# Patient Record
Sex: Male | Born: 1970 | Race: Black or African American | Hispanic: No | Marital: Single
Health system: Midwestern US, Community
[De-identification: ages and names within clinical notes are randomized; demographics above are authoritative.]

## PROBLEM LIST (undated history)

## (undated) DIAGNOSIS — Z789 Other specified health status: Secondary | ICD-10-CM

---

## 2006-10-26 ENCOUNTER — Emergency Department (HOSPITAL_COMMUNITY): Admission: EM | Admit: 2006-10-26 | Discharge: 2006-10-26 | Payer: Self-pay | Admitting: Emergency Medicine

## 2014-05-19 MED ORDER — HYDROCODONE-ACETAMINOPHEN 5 MG-325 MG TAB
5-325 mg | ORAL_TABLET | ORAL | Status: AC | PRN
Start: 2014-05-19 — End: ?

## 2014-05-19 MED ORDER — MORPHINE 2 MG/ML INJECTION
2 mg/mL | INTRAMUSCULAR | Status: AC
Start: 2014-05-19 — End: 2014-05-19
  Administered 2014-05-19: 16:00:00 via INTRAVENOUS

## 2014-05-19 MED ORDER — MORPHINE 4 MG/ML SYRINGE
4 mg/mL | INTRAMUSCULAR | Status: AC
Start: 2014-05-19 — End: 2014-05-19
  Administered 2014-05-19: 17:00:00 via INTRAVENOUS

## 2014-05-19 MED FILL — MORPHINE 2 MG/ML INJECTION: 2 mg/mL | INTRAMUSCULAR | Qty: 2

## 2014-05-19 MED FILL — MORPHINE 4 MG/ML SYRINGE: 4 mg/mL | INTRAMUSCULAR | Qty: 1

## 2014-05-19 NOTE — ED Notes (Signed)
Triage note: Patient arrives via EMS due to fall through a ceiling, fall broken by some wiring, denies hitting head, denies LOC, c/o right wrist pain. + deformity noted to right wrist, limited movement. PVS intact.

## 2014-05-19 NOTE — ED Notes (Signed)
Patient verbalizes understanding of discharge instructions. Ambulatory and in no acute distress at discharge.

## 2014-05-19 NOTE — Consults (Signed)
ORTHO CONSULT    Subjective:     Date of Consultation:  May 19, 2014    Referring Physician: Claire ShownJohn Coyner, MD    Thomas GamblesMark Wittwer is a 43 y.o. African American male who is being seen for an acute injury to his right wrist. This injury occurred earlier today while he was at work. He states that he fell through a ceiling and tried to cushion his fall with an outstretched right arm. Workup has revealed a comminuted, impacted, and intra-articular distal radius fracture. He denies other associated injuries.     There are no active problems to display for this patient.    History reviewed. No pertinent family history.   History   Substance Use Topics   ??? Smoking status: Never Smoker    ??? Smokeless tobacco: Never Used   ??? Alcohol Use: Yes     History reviewed. No pertinent past medical history.   History reviewed. No pertinent past surgical history.   Prior to Admission medications    Medication Sig Start Date End Date Taking? Authorizing Provider   HYDROcodone-acetaminophen (NORCO) 5-325 mg per tablet Take 1 Tab by mouth every four (4) hours as needed for Pain. Max Daily Amount: 6 Tabs. 05/19/14  Yes Daryll BrodJohn L Coyner, MD     No current facility-administered medications for this encounter.     Current Outpatient Prescriptions   Medication Sig   ??? HYDROcodone-acetaminophen (NORCO) 5-325 mg per tablet Take 1 Tab by mouth every four (4) hours as needed for Pain. Max Daily Amount: 6 Tabs.     No Known Allergies     Review of Systems:  A comprehensive review of systems was negative except for that written in the HPI.    Objective:     Patient Vitals for the past 8 hrs:   BP Temp Pulse Resp SpO2 Height Weight   05/19/14 1330 115/73 mmHg 98 ??F (36.7 ??C) 50 18 97 % - -   05/19/14 1118 107/74 mmHg 98.5 ??F (36.9 ??C) 47 16 100 % 5\' 4"  (1.626 m) 68.04 kg (150 lb)     Temp (24hrs), Avg:98.3 ??F (36.8 ??C), Min:98 ??F (36.7 ??C), Max:98.5 ??F (36.9 ??C)        EXAM: General: alert, cooperative, no distress, appears stated age   Musculoskeletal: diffuse swelling/tenderness right wrist, minimal deformity  Neurological: negative for memory problems, speech problems, paresthesia and coordination problems  Skin:intact, no sign of open fracture    CR WRIST MIN 3 VWS RT - [id number] - May 19 2014 12:06PM  Accession No: [id number]  Reason: Trauma  REPORT:  EXAM: CR WRIST MIN 3 VWS RT  INDICATION: Fall, wrist pain  COMPARISON: None.  FINDINGS: Three views of the right wrist demonstrate a comminuted impaction   fracture the distal radius with intra-articular extension. Diffuse soft   tissue swelling is noted. There may be a fracture of the dorsum of the   triquetrum as well.   IMPRESSION: Comminuted impaction fracture distal radius. Possible of   fracture the dorsum of the triquetrum.  Signing/Reading Doctor: Gordan PaymentKAREN L. KILLEEN [phone]   Approved: Gordan PaymentKAREN L. KILLEEN [phone]([id number]) May 19 2014 12:27PM     Assessment/Plan:     Right distal radius fracture     Discussed diagnosis and treatment plan with patient. I explained that I did not feel that I could improve the alignment significantly with closed reduction and that he would need surgical intervention to restore the radiocarpal articular surface. I explained that I felt this  fracture would not do well with non-operative management. He seemed to understand. He was splinted and provided with pain medication. I recommended follow-up with Dr. Marquita Palms in 1-2 days      Sherlyn Lees, PA

## 2014-05-19 NOTE — ED Provider Notes (Signed)
HPI Comments: 43 y.o. male with no significant past medical history who presents via EMS with chief complaint of wrist pain. Patient reports right wrist pain following a fall "through the ceiling" from a height of "about 7-8 feet." Patient reports he is right-hand dominant. Patient denies a LOC. Patient denies seeing an orthopedist in the past. Patient denies a history of any other medical conditions. There are no other acute medical concerns at this time.    PCP: None (General)    Note written by Bethann Humble, Scribe, as dictated by Daryll Brod, MD 11:45 AM    The history is provided by the patient.        History reviewed. No pertinent past medical history.     History reviewed. No pertinent past surgical history.      History reviewed. No pertinent family history.     History     Social History   ??? Marital Status: SINGLE     Spouse Name: N/A     Number of Children: N/A   ??? Years of Education: N/A     Occupational History   ??? Not on file.     Social History Main Topics   ??? Smoking status: Never Smoker    ??? Smokeless tobacco: Never Used   ??? Alcohol Use: Yes   ??? Drug Use: No   ??? Sexual Activity: Not on file     Other Topics Concern   ??? Not on file     Social History Narrative   ??? No narrative on file                  ALLERGIES: Review of patient's allergies indicates no known allergies.      Review of Systems   Musculoskeletal: Positive for arthralgias.   All other systems reviewed and are negative.      Filed Vitals:    05/19/14 1118   BP: 107/74   Pulse: 47   Temp: 98.5 ??F (36.9 ??C)   Resp: 16   Height: 5\' 4"  (1.626 m)   Weight: 68.04 kg (150 lb)   SpO2: 100%            Physical Exam   Constitutional: He is oriented to person, place, and time. He appears well-developed and well-nourished.   HENT:   Head: Normocephalic and atraumatic.   Nose: Nose normal.   Mouth/Throat: Oropharynx is clear and moist.   Eyes: Conjunctivae and EOM are normal. Pupils are equal, round, and  reactive to light. Right eye exhibits no discharge. Left eye exhibits no discharge.   Neck: Normal range of motion. Neck supple.   Cardiovascular: Normal rate, regular rhythm and intact distal pulses.  Exam reveals no gallop and no friction rub.    No murmur heard.  Pulmonary/Chest: Effort normal and breath sounds normal. No respiratory distress. He has no wheezes. He has no rales. He exhibits no tenderness.   Abdominal: Soft. Bowel sounds are normal. He exhibits no distension and no mass. There is no tenderness. There is no rebound and no guarding.   nttp     Musculoskeletal: He exhibits tenderness. He exhibits no edema.        Right wrist: He exhibits decreased range of motion, tenderness, bony tenderness, swelling, effusion and deformity. He exhibits no laceration.   R wrist - obvious deformity/ decreased ROM 2ary to pain/ pt has distal motor/ CV/ Sensation grossly intact     Pt had central/ paraspinal C/T/L spines, Upper/Lower  ext long bones, Abdomen,  Pelvis, hands /feet and all joints palpated and tolerated well (except as above) ; Pt has motor/ CV / Sensation grossly intact to all extremities;     Lymphadenopathy:     He has no cervical adenopathy.   Neurological: He is alert and oriented to person, place, and time. No cranial nerve deficit. Coordination normal.   Skin: Skin is warm and dry. No rash noted. No erythema.   Psychiatric: He has a normal mood and affect.   Nursing note and vitals reviewed.       MDM  Number of Diagnoses or Management Options  Radius distal fracture, right, closed, initial encounter:   Triquetral chip fracture, right, closed, initial encounter:       Procedures    CONSULT NOTE:  12:15 PM Daryll Brod, MD spoke with Sherlyn Lees, PA, Consult for Orthopedics. Discussed available diagnostic tests and clinical findings. Provider is in agreement with care plans as outlined. Provider will come see the patient.    PROGRESS NOTE:  12:31 PM   Discussed the patient's results and the possibility of surgery with him; patient understands and agrees with plan. Orthopedics PA is seeing the patient now.     CONSULT NOTE:  12:56 PM Daryll Brod, MD spoke with Sherlyn Lees, PA, Consult for Orthopedics. Discussed available diagnostic tests and clinical findings. Provider is in agreement with care plans as outlined. Provider recommends pain medications and a follow-up appointment with the Orthopedics office for surgery.    1:20 PM  Thomas Dunn  results have been reviewed with him.  He has been counseled regarding his diagnosis.  He verbally conveys understanding and agreement of the signs, symptoms, diagnosis, treatment and prognosis and additionally agrees to Call/ Arrange follow up as recommended with Dr. Marquita Palms  in 24 - 48 hours.  He also agrees with the care-plan and conveys that all of his questions have been answered.  I have also put together some discharge instructions for him that include: 1) educational information regarding their diagnosis, 2) how to care for their diagnosis at home, as well a 3) list of reasons why they would want to return to the ED prior to their follow-up appointment, should their condition change or for concerns.

## 2014-05-20 NOTE — ED Notes (Signed)
Discharge instructions reviewed with the patient who verbalized understanding.  Pt is ambulatory from the department.

## 2014-05-20 NOTE — ED Notes (Signed)
He was seen here yesterday for a right arm Fx.  He is here now with a splint from yesterday saying his fingers were swollen and tingling. He believed the splint was too tight. He is also saying the pain medication he was given is not helping, he was unable to sleep last night because of the pain.the three ace wraps were removed in triage.

## 2014-05-20 NOTE — ED Provider Notes (Signed)
HPI Comments: 43 y.o. male with no significant past medical history who presents with chief complaint of right arm fracture. Pt was treated and evaluated for right arm pain by [facility]. Pt was diagnosed with right distal radius fracture and discharged with a prescription for 20 x Norco 5-325 mg. Pt reports right arm pain is not alleviated by 1 Norco every 4 hours or 2 Advil today and is accompanied by "tingling numbness" in the right 2nd and 3rd fingers. Pt denies any other acute medical problems. There are no other acute medical concerns at this time.    PCP: None (General)  Note written by Sherrin DaisyEric Whalen-Kelly, Scribe, as dictated by Thornton PapasLuis F Yasemin Rabon, MD 4:54 PM          The history is provided by the patient.        No past medical history on file.     No past surgical history on file.      No family history on file.     History     Social History   ??? Marital Status: SINGLE     Spouse Name: N/A     Number of Children: N/A   ??? Years of Education: N/A     Occupational History   ??? Not on file.     Social History Main Topics   ??? Smoking status: Never Smoker    ??? Smokeless tobacco: Never Used   ??? Alcohol Use: Yes   ??? Drug Use: No   ??? Sexual Activity: Not on file     Other Topics Concern   ??? Not on file     Social History Narrative   ??? No narrative on file                  ALLERGIES: Review of patient's allergies indicates no known allergies.      Review of Systems   Constitutional: Negative for fever, chills and diaphoresis.   HENT: Negative for congestion, postnasal drip, rhinorrhea and sore throat.    Eyes: Negative for photophobia, discharge, redness and visual disturbance.   Respiratory: Negative for cough, chest tightness, shortness of breath and wheezing.    Cardiovascular: Negative for chest pain, palpitations and leg swelling.   Gastrointestinal: Negative for nausea, vomiting, abdominal pain, diarrhea, constipation, blood in stool and abdominal distention.    Genitourinary: Negative for dysuria, urgency, frequency, hematuria and difficulty urinating.   Musculoskeletal: Positive for myalgias.   Skin: Negative for color change and rash.   Neurological: Positive for numbness. Negative for dizziness, speech difficulty, weakness, light-headedness and headaches.   Psychiatric/Behavioral: Negative for confusion. The patient is not nervous/anxious.    All other systems reviewed and are negative.      Filed Vitals:    05/20/14 1622   BP: 140/84   Temp: 98.4 ??F (36.9 ??C)   Resp: 18   Height: 5\' 4"  (1.626 m)   Weight: 68.04 kg (150 lb)   SpO2: 99%            Physical Exam   Constitutional: He is oriented to person, place, and time. He appears well-developed and well-nourished. No distress.   HENT:   Head: Normocephalic and atraumatic.   Right Ear: External ear normal.   Left Ear: External ear normal.   Nose: Nose normal.   Mouth/Throat: Oropharynx is clear and moist.   Eyes: Conjunctivae and EOM are normal. Pupils are equal, round, and reactive to light. No scleral icterus.  Neck: Neck supple. No JVD present. No tracheal deviation present. No thyromegaly present.   Cardiovascular: Normal rate, regular rhythm and normal heart sounds.  Exam reveals no gallop and no friction rub.    No murmur heard.  Pulmonary/Chest: Effort normal and breath sounds normal. No respiratory distress. He has no wheezes. He has no rales. He exhibits no tenderness.   Abdominal: Soft. Bowel sounds are normal. He exhibits no distension and no mass. There is no tenderness. There is no rebound and no guarding.   Musculoskeletal:   Right arm: splint is in place; distal neuromotor is intake; there is good capillary refill; Pt is able to move his digits in split without pain.   Lymphadenopathy:     He has no cervical adenopathy.   Neurological: He is alert and oriented to person, place, and time. No cranial nerve deficit. Coordination normal.   Skin: Skin is warm and dry. No rash noted. He is not diaphoretic. No  erythema.   Psychiatric: He has a normal mood and affect. His behavior is normal. Judgment and thought content normal.   Nursing note and vitals reviewed.  Note written by Sherrin Daisy, Scribe, as dictated by Thornton Papas, MD 4:54 PM    MDM  Number of Diagnoses or Management Options  Right arm fracture, sequela:   Diagnosis management comments: Impression: Patient status post right wrist fracture with an impacted distal radius and possible triquetral fracture presents with increased pain while wearing his splint. After removal of the Ace wrap the patient has good capillary refill there is no overt swelling of the digits.    Patient has been instructed to elevate his arm above the level of the heart, continue with his narcotic pain medication, and start high-dose ibuprofen.      Procedures

## 2014-10-15 HISTORY — PX: WRIST FRACTURE SURGERY: SHX121

## 2020-03-15 ENCOUNTER — Ambulatory Visit (HOSPITAL_COMMUNITY)
Admission: EM | Admit: 2020-03-15 | Discharge: 2020-03-15 | Disposition: A | Discharge status: Home / Self Care | Payer: Self-pay | Attending: Family Medicine | Admitting: Family Medicine

## 2020-03-15 ENCOUNTER — Encounter (HOSPITAL_COMMUNITY): Payer: Self-pay

## 2020-03-15 ENCOUNTER — Other Ambulatory Visit: Payer: Self-pay

## 2020-03-15 DIAGNOSIS — M79641 Pain in right hand: Secondary | ICD-10-CM

## 2020-03-15 DIAGNOSIS — M25531 Pain in right wrist: Secondary | ICD-10-CM

## 2020-03-15 HISTORY — DX: Other specified health status: Z78.9

## 2020-03-15 MED ORDER — PREDNISONE 10 MG (21) PO TBPK: ORAL_TABLET | Freq: Every day | ORAL | 0 refills | Status: AC

## 2020-03-15 MED ORDER — TRAMADOL HCL 50 MG PO TABS: 50.0000 mg | ORAL_TABLET | Freq: Four times a day (QID) | ORAL | 0 refills | Status: AC | PRN

## 2020-03-15 NOTE — Discharge Instructions (Signed)
Be aware, you have been prescribed pain medications that may cause drowsiness. Do not combine with alcohol or other illicit drugs. Please do not drive, operate heavy machinery, or take part in activities that require making important decisions while on this medication as your judgement may be clouded.  

## 2020-03-15 NOTE — ED Triage Notes (Signed)
C/o right hand and wrist pain. Reports a tightness in his fourth and fifth metacarpals.

## 2020-03-16 NOTE — ED Provider Notes (Signed)
[place]   [id number] 03/15/20 Arrival Time: 7829  ASSESSMENT & PLAN:  1. Right hand pain   2. Right wrist pain     No indications for imaging of R hand or wrist.  Placed in wrist splint. Question ulnar nerve inflammation. Discussed.  Begin: Meds ordered this encounter  Medications  . predniSONE (STERAPRED UNI-PAK 21 TAB) 10 MG (21) TBPK tablet    Sig: Take by mouth daily. Take as directed.    Dispense:  21 tablet    Refill:  0  . traMADol (ULTRAM) 50 MG tablet    Sig: Take 1 tablet (50 mg total) by mouth every 6 (six) hours as needed.    Dispense:  12 tablet    Refill:  0     Recommend: Follow-up Information    [place].   Why: If worsening or failing to improve as anticipated. Contact information: [address] [phone]          [place] Controlled Substances Registry consulted for this patient. I feel the risk/benefit ratio today is favorable for proceeding with this prescription for a controlled substance. Medication sedation precautions given.  Reviewed expectations re: course of current medical issues. Questions answered. Outlined signs and symptoms indicating need for more acute intervention. Patient verbalized understanding. After Visit Summary given.  SUBJECTIVE: History from: patient. Jenaro Souder is a 49 y.o. male who reports fairly persistent moderate pain of his right wrist and ulnar hand; described as dull; without radiation. Onset: gradual. First noted: over the past several days. Injury/trama: no. Symptoms have progressed to a point and plateaued since beginning. Aggravating factors: certain movements and grasping. Alleviating factors: have not been identified. Associated symptoms: none reported. Extremity sensation changes or weakness: 4th and 5th fingers "with a numb feeling". Self treatment: has not tried OTC therapies.   Does have a h/o wrist surgery from  fracture; hardware present.  Past Surgical History:  Procedure Laterality Date  . WRIST FRACTURE SURGERY Right 2016      OBJECTIVE:  Vitals:   03/15/20 1851  BP: (!) 161/98  Pulse: 64  Resp: 14  Temp: 98.7 F (37.1 C)  TempSrc: Oral  SpO2: 99%    General appearance: alert; no distress HEENT: [place]; AT Neck: supple with FROM Resp: unlabored respirations Extremities: . RUE: warm with well perfused appearance; poorly localized moderate tenderness over right volar wrist into palm of ulnar hand; holds 4/5 fingers in slight flexion; without swelling; bruising: none; wrist ROM: normal CV: brisk extremity capillary refill of all R fingers. Skin: warm and dry; no visible rashes Neurologic: gait normal; normal strength of RUE; does reports decreased sensation over 4/5 fingers Psychological: alert and cooperative; normal mood and affect   No Known Allergies  Past Medical History:  Diagnosis Date  . No pertinent past medical history    Social History   Socioeconomic History  . Marital status: Single    Spouse name: Not on file  . Number of children: Not on file  . Years of education: Not on file  . Highest education level: Not on file  Occupational History  . Not on file  Tobacco Use  . Smoking status: Light Tobacco Smoker    Types: Cigars  . Smokeless tobacco: Never Used  . Tobacco comment: "cigar every onces in a blue moon"  Substance and Sexual Activity  . Alcohol use: Yes    Comment: 1-2 beers on the weekends  .  Drug use: Not on file  . Sexual activity: Not on file  Other Topics Concern  . Not on file  Social History Narrative  . Not on file   Social Determinants of Health   Financial Resource Strain:   . Difficulty of Paying Living Expenses:   Food Insecurity:   . Worried About Programme researcher, broadcasting/film/video in the Last Year:   . Barista in the Last Year:   Transportation Needs:   . Freight forwarder (Medical):   Marland Kitchen Lack of Transportation (Non-Medical):    Physical Activity:   . Days of Exercise per Week:   . Minutes of Exercise per Session:   Stress:   . Feeling of Stress :   Social Connections:   . Frequency of Communication with Friends and Family:   . Frequency of Social Gatherings with Friends and Family:   . Attends Religious Services:   . Active Member of Clubs or Organizations:   . Attends Banker Meetings:   Marland Kitchen Marital Status:    No family history on file. Past Surgical History:  Procedure Laterality Date  . WRIST FRACTURE SURGERY Right 2016      Mardella Layman, MD 03/16/20 1125

## 2020-03-21 ENCOUNTER — Ambulatory Visit (INDEPENDENT_AMBULATORY_CARE_PROVIDER_SITE_OTHER): Payer: Worker's Compensation

## 2020-03-21 ENCOUNTER — Encounter (HOSPITAL_COMMUNITY): Payer: Self-pay | Admitting: Emergency Medicine

## 2020-03-21 ENCOUNTER — Other Ambulatory Visit: Payer: Self-pay

## 2020-03-21 ENCOUNTER — Ambulatory Visit (HOSPITAL_COMMUNITY)
Admission: EM | Admit: 2020-03-21 | Discharge: 2020-03-21 | Disposition: A | Discharge status: Home / Self Care | Payer: Worker's Compensation | Attending: Family Medicine | Admitting: Family Medicine

## 2020-03-21 DIAGNOSIS — M25521 Pain in right elbow: Secondary | ICD-10-CM | POA: Diagnosis not present

## 2020-03-21 DIAGNOSIS — M25531 Pain in right wrist: Secondary | ICD-10-CM | POA: Diagnosis not present

## 2020-03-21 DIAGNOSIS — G5621 Lesion of ulnar nerve, right upper limb: Secondary | ICD-10-CM | POA: Diagnosis not present

## 2020-03-21 DIAGNOSIS — M79641 Pain in right hand: Secondary | ICD-10-CM

## 2020-03-21 NOTE — Discharge Instructions (Addendum)
Please follow up for your ongoing symptoms

## 2020-03-21 NOTE — ED Triage Notes (Signed)
Pt c/o right arm and hand pain onset x 1 week ago. Pt states he has been wearing the brace given and pain has been worse. Pt states he is unable to straighten his fourth and fifth fingers.

## 2020-03-21 NOTE — ED Provider Notes (Signed)
[facility]    CSN: [id number] Arrival date & time: 03/21/20  1743      History   Chief Complaint Chief Complaint  Patient presents with  . Hand Pain    HPI Avery Klingbeil is a 49 y.o. male.   He is presenting with inability to flex and extend the fourth and fifth digit of the right hand.  He was at work and had an accident.  He had a injury to his elbow and since that time he has had pain and changes in the hand.  He is using a wrist brace with no improvement.  Has not had improvement with medications.  No history of similar pain.  Unable to most of his activities at work due to this.  Symptoms are staying the same.  HPI  Past Medical History:  Diagnosis Date  . No pertinent past medical history     There are no problems to display for this patient.   Past Surgical History:  Procedure Laterality Date  . WRIST FRACTURE SURGERY Right 2016       Home Medications    Prior to Admission medications   Medication Sig Start Date End Date Taking? Authorizing Provider  predniSONE (STERAPRED UNI-PAK 21 TAB) 10 MG (21) TBPK tablet Take by mouth daily. Take as directed. 03/15/20  Yes Hagler, Arlys John, MD  traMADol (ULTRAM) 50 MG tablet Take 1 tablet (50 mg total) by mouth every 6 (six) hours as needed. 03/15/20  Yes Mardella Layman, MD    Family History History reviewed. No pertinent family history.  Social History Social History   Tobacco Use  . Smoking status: Light Tobacco Smoker    Types: Cigars  . Smokeless tobacco: Never Used  . Tobacco comment: "cigar every onces in a blue moon"  Substance Use Topics  . Alcohol use: Yes    Comment: 1-2 beers on the weekends  . Drug use: Not on file     Allergies   Patient has no known allergies.   Review of Systems Review of Systems  See HPI  Physical Exam Triage Vital Signs ED Triage Vitals  Enc Vitals Group     BP 03/21/20 1846 140/90     Pulse Rate 03/21/20 1846 (!) 57     Resp 03/21/20 1846 14     Temp 03/21/20  1846 97.6 F (36.4 C)     Temp Source 03/21/20 1846 Oral     SpO2 03/21/20 1846 98 %     Weight --      Height --      Head Circumference --      Peak Flow --      Pain Score 03/21/20 1849 10     Pain Loc --      Pain Edu? --      Excl. in GC? --    No data found.  Updated Vital Signs BP 140/90 (BP Location: Left Arm)   Pulse (!) 57   Temp 97.6 F (36.4 C) (Oral)   Resp 14   SpO2 98%   Visual Acuity Right Eye Distance:   Left Eye Distance:   Bilateral Distance:    Right Eye Near:   Left Eye Near:    Bilateral Near:     Physical Exam Gen: NAD, alert, cooperative with exam, well-appearing ENT: normal lips, normal nasal mucosa,  Neuro: normal tone, normal sensation to touch Psych:  normal insight, alert and oriented MSK:  Right hand: Clawing of the fourth and fifth  digit of the right hand. Unable to extend and flex freely of the fourth and fifth digit. No malrotation or misalignment. Positive Tinel's at the elbow. Normal elbow range of motion. Neurovascularly intact   UC Treatments / Results  Labs (all labs ordered are listed, but only abnormal results are displayed) Labs Reviewed - No data to display  EKG   Radiology DG Elbow Complete Right  Result Date: 03/21/2020 CLINICAL DATA:  49 year old male status post blunt trauma 1 week ago with continued pain. EXAM: RIGHT ELBOW - COMPLETE 3+ VIEW COMPARISON:  None. FINDINGS: No evidence of elbow joint effusion. Bone mineralization is within normal limits. Preserved joint spaces and alignment. Small chronic appearing ossific fragment at the lateral epicondyle. No acute osseous abnormality identified. No discrete soft tissue injury. IMPRESSION: No acute osseous abnormality identified about the right elbow. Electronically Signed   By: Genevie Ann M.D.   On: 03/21/2020 20:28   DG Wrist Complete Right  Result Date: 03/21/2020 CLINICAL DATA:  49 year old male status post blunt trauma 1 week ago with continued pain. Prior  fracture and surgery. EXAM: RIGHT WRIST - COMPLETE 3+ VIEW COMPARISON:  None. FINDINGS: Previous distal right radius ORIF. Hardware appears intact. Healed distal right radius fracture. Distal ulna appears intact. Radiocarpal joint space loss maximal at the lunate. Scapholunate alignment and joint space appears maintained. Other carpal bone alignment and joint spaces within normal limits. Metacarpals appear intact. IMPRESSION: 1. No acute osseous abnormality identified about the right wrist. 2. Prior distal right radius ORIF with no adverse hardware features. 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 Electronically Signed   By: Genevie Ann M.D.   On: 03/21/2020 20:21   DG Hand Complete Right  Result Date: 03/21/2020 CLINICAL DATA:  49 year old male status post blunt trauma 1 week ago with continued pain. Prior fracture and surgery. EXAM: RIGHT HAND - COMPLETE 3+ VIEW COMPARISON:  Right wrist today reported separately. FINDINGS: Wrist details reported separately. Right metacarpals and phalanges appear intact. MCP and IP joints are normal for age. No discrete soft tissue injury. IMPRESSION: 1. No acute fracture or dislocation identified about the right hand. 2. Right wrist detailed separately today. Electronically Signed   By: Genevie Ann M.D.   On: 03/21/2020 20:23    Procedures Procedures (including critical care time)  Medications Ordered in UC Medications - No data to display  Initial Impression / Assessment and Plan / UC Course  I have reviewed the triage vital signs and the nursing notes.  Pertinent labs & imaging results that were available during my care of the patient were reviewed by me and considered in my medical decision making (see chart for details).     Mr. Hoadley is a 49 year old male is presenting with ulnar palsy after an injury that occurred at work.  Imaging was negative for acute changes.  Counseled on care and further work-up needed for his injury.  Provided work note.  Given occasions  of follow-up and return.  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   Rosemarie Ax, MD 03/21/20 2134

## 2020-03-23 ENCOUNTER — Ambulatory Visit: Payer: Self-pay

## 2020-03-23 ENCOUNTER — Encounter: Payer: Self-pay | Admitting: Family Medicine

## 2020-03-23 ENCOUNTER — Other Ambulatory Visit: Payer: Self-pay

## 2020-03-23 ENCOUNTER — Ambulatory Visit (INDEPENDENT_AMBULATORY_CARE_PROVIDER_SITE_OTHER): Payer: Worker's Compensation | Admitting: Family Medicine

## 2020-03-23 VITALS — BP 158/88 | HR 67 | Ht 69.0 in | Wt 160.0 lb

## 2020-03-23 DIAGNOSIS — G5620 Lesion of ulnar nerve, unspecified upper limb: Secondary | ICD-10-CM

## 2020-03-23 NOTE — Assessment & Plan Note (Signed)
Occurred after an injury at work on 6/1. He hit his ulnar aspect of the posterior elbow. Changes in his hand have been ongoing since.  - counseled on splinting fingers  - counseled on home exercise therapy and supportive care. -Referral to neurology for nerve conduction study. - provided work note

## 2020-03-23 NOTE — Progress Notes (Signed)
Nicolas Moore - 49 y.o. male MRN [id number]  Date of birth: 20-Aug-1971  SUBJECTIVE:  Including CC & ROS.  Chief Complaint  Patient presents with  . Hand Injury    right ring/pinky    Nicolas Moore is a 49 y.o. male that is presenting with altered sensation of the ulnar aspect of his hand and clawing of his fourth and fifth digit.  He had an injury that occurred at work on 6/1.  Since that time he has been able to move his fourth and fifth digit accordingly.  He also has had this pain and altered sensation along the ulnar aspect of his forearm and into his hand.  It feels tight and like he is getting stabbed on the inside of the hypothenar eminence.   Review of Systems See HPI   HISTORY: Past Medical, Surgical, Social, and Family History Reviewed & Updated per EMR.   Pertinent Historical Findings include:  Past Medical History:  Diagnosis Date  . No pertinent past medical history     Past Surgical History:  Procedure Laterality Date  . WRIST FRACTURE SURGERY Right 2016    No family history on file.  Social History   Socioeconomic History  . Marital status: Single    Spouse name: Not on file  . Number of children: Not on file  . Years of education: Not on file  . Highest education level: Not on file  Occupational History  . Not on file  Tobacco Use  . Smoking status: Light Tobacco Smoker    Types: Cigars  . Smokeless tobacco: Never Used  . Tobacco comment: "cigar every onces in a blue moon"  Substance and Sexual Activity  . Alcohol use: Yes    Comment: 1-2 beers on the weekends  . Drug use: Not on file  . Sexual activity: Not on file  Other Topics Concern  . Not on file  Social History Narrative  . Not on file   Social Determinants of Health   Financial Resource Strain:   . Difficulty of Paying Living Expenses:   Food Insecurity:   . Worried About Charity fundraiser in the Last Year:   . Arboriculturist in the Last Year:   Transportation Needs:   . Lexicographer (Medical):   Marland Kitchen Lack of Transportation (Non-Medical):   Physical Activity:   . Days of Exercise per Week:   . Minutes of Exercise per Session:   Stress:   . Feeling of Stress :   Social Connections:   . Frequency of Communication with Friends and Family:   . Frequency of Social Gatherings with Friends and Family:   . Attends Religious Services:   . Active Member of Clubs or Organizations:   . Attends Archivist Meetings:   Marland Kitchen Marital Status:   Intimate Partner Violence:   . Fear of Current or Ex-Partner:   . Emotionally Abused:   Marland Kitchen Physically Abused:   . Sexually Abused:      PHYSICAL EXAM:  VS: BP (!) 158/88   Pulse 67   Ht 5\' 9"  (1.753 m)   Wt 160 lb (72.6 kg)   BMI 23.63 kg/m  Physical Exam Gen: NAD, alert, cooperative with exam, well-appearing MSK:  Right hand: Clawing of the fourth and fifth digit. Limited extension of the fourth and fifth digit. Able to flex the fourth and fifth digit. Limited abduction and adduction of the fourth and fifth digit   Limited ultrasound: right elbow:  There appears hypoechoic change and thickening of the ulnar nerve just proximal to the cubital tunnel. There doesn't appear transection of the nerve. No transposition with dynamic testing.   Summary: ulnar nerve thickening   Ultrasound and interpretation by Clare Gandy, MD      ASSESSMENT & PLAN:   Ulnar nerve palsy Occurred after an injury at work on 6/1. He hit his ulnar aspect of the posterior elbow. Changes in his hand have been ongoing since.  - counseled on splinting fingers  - counseled on home exercise therapy and supportive care. -Referral to neurology for nerve conduction study. - provided work note

## 2020-03-23 NOTE — Patient Instructions (Signed)
Good to see you Please splint the fingers and brace through the day   Please send me a message in MyChart with any questions or updates.  We will set up a virtual visit once the nerve study is resulted.   --Dr. Jordan Likes

## 2020-04-07 ENCOUNTER — Telehealth (INDEPENDENT_AMBULATORY_CARE_PROVIDER_SITE_OTHER): Payer: Worker's Compensation | Admitting: Family Medicine

## 2020-04-07 ENCOUNTER — Other Ambulatory Visit: Payer: Self-pay

## 2020-04-07 VITALS — Ht 69.0 in | Wt 160.0 lb

## 2020-04-07 DIAGNOSIS — G5621 Lesion of ulnar nerve, right upper limb: Secondary | ICD-10-CM

## 2020-04-07 DIAGNOSIS — G5620 Lesion of ulnar nerve, unspecified upper limb: Secondary | ICD-10-CM

## 2020-04-07 MED ORDER — HYDROCODONE-ACETAMINOPHEN 5-325 MG PO TABS: 1.0000 | ORAL_TABLET | Freq: Three times a day (TID) | ORAL | 0 refills | Status: AC | PRN

## 2020-04-07 NOTE — Assessment & Plan Note (Signed)
Significant changes observed on EMG.  Unable to locate the lesion from his injury at work. -MRI to evaluate ulnar nerve in the forearm.

## 2020-04-07 NOTE — Progress Notes (Signed)
Virtual Visit via Telephone Note  I connected with Nicolas Moore on 04/07/20 at  3:10 PM EDT by telephone and verified that I am speaking with the correct person using two identifiers.   I discussed the limitations, risks, security and privacy concerns of performing an evaluation and management service by telephone and the availability of in person appointments. I also discussed with the patient that there may be a patient responsible charge related to this service. The patient expressed understanding and agreed to proceed.  Patient: home  Physician: office   History of Present Illness:  Mr. Nicolas Moore is a 49 yo M that is following up after his EMG.  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  They were unable to isolate the site of the lesion with a nerve conduction study due to the very low amplitude.   Observations/Objective:   Assessment and Plan:  Ulnar nerve palsy: Significant changes observed on EMG.  Unable to locate the lesion from his injury at work. -MRI to evaluate ulnar nerve in the forearm.  Follow Up Instructions:    I discussed the assessment and treatment plan with the patient. The patient was provided an opportunity to ask questions and all were answered. The patient agreed with the plan and demonstrated an understanding of the instructions.   The patient was advised to call back or seek an in-person evaluation if the symptoms worsen or if the condition fails to improve as anticipated.  I provided 6 minutes of non-face-to-face time during this encounter.   Nicolas Gandy, MD

## 2020-04-08 ENCOUNTER — Encounter: Payer: Self-pay | Admitting: Family Medicine

## 2020-04-19 ENCOUNTER — Other Ambulatory Visit: Payer: Self-pay | Admitting: Family Medicine

## 2020-04-19 DIAGNOSIS — G5620 Lesion of ulnar nerve, unspecified upper limb: Secondary | ICD-10-CM

## 2020-04-19 NOTE — Progress Notes (Signed)
Referral placed to hand orthopedic surgery for MRI of his right elbow showing ulnar neuropathy with edematous and thickened ulnar nerve from the level of the elbow through the forearm.  Myra Rude, MD [facility] 04/19/2020, 9:39 AM

## 2020-04-21 ENCOUNTER — Encounter: Payer: Self-pay | Admitting: Family Medicine

## 2020-04-28 ENCOUNTER — Telehealth: Payer: Self-pay | Admitting: Family Medicine

## 2020-04-28 NOTE — Telephone Encounter (Signed)
Rcvd call frm [facility] in [place], [place] (pt was referred to them by [facility] to have MRI perform.  --Phone call disconnected attempts to cll back unsuccessfuly

## 2022-02-15 IMAGING — DX DG ELBOW COMPLETE 3+V*R*
4 series · 4 of 4 positions shown · non-contrast
Comparison: None.

CLINICAL DATA: 48-year-old male status post blunt trauma 1 week ago
with continued pain.

EXAM:
RIGHT ELBOW - COMPLETE 3+ VIEW

[elbow ap]
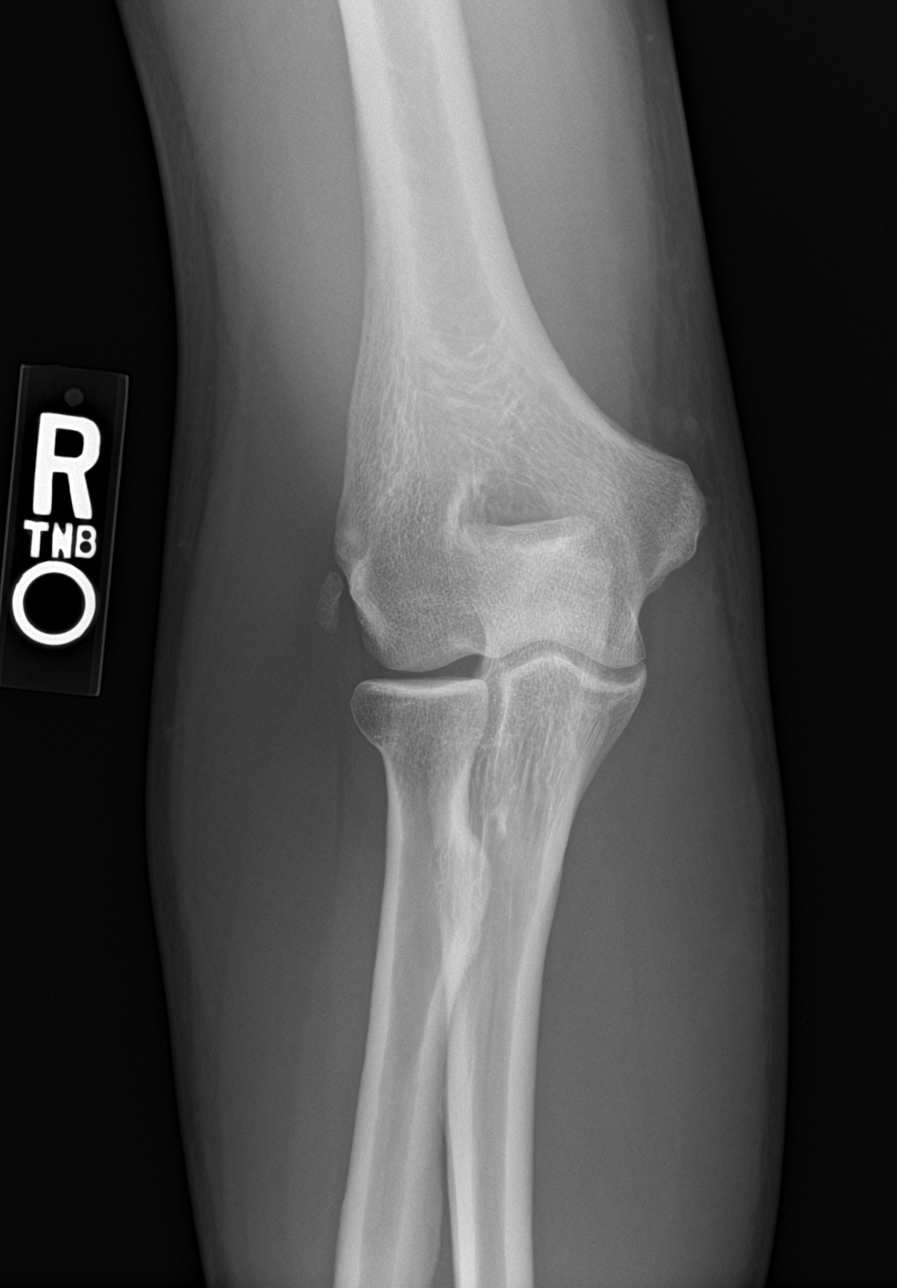

[elbow obl (1 of 2)]
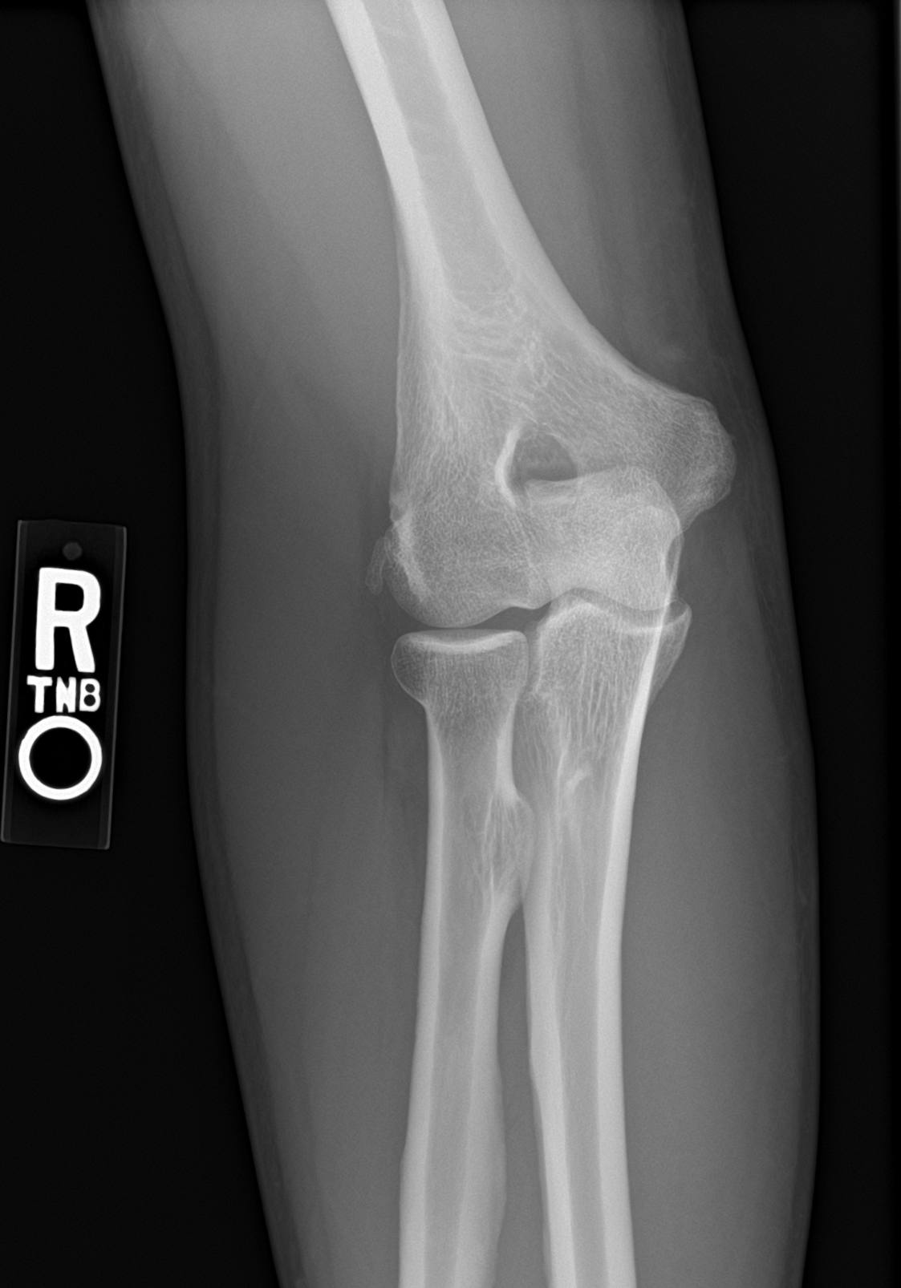

[elbow obl (2 of 2)]
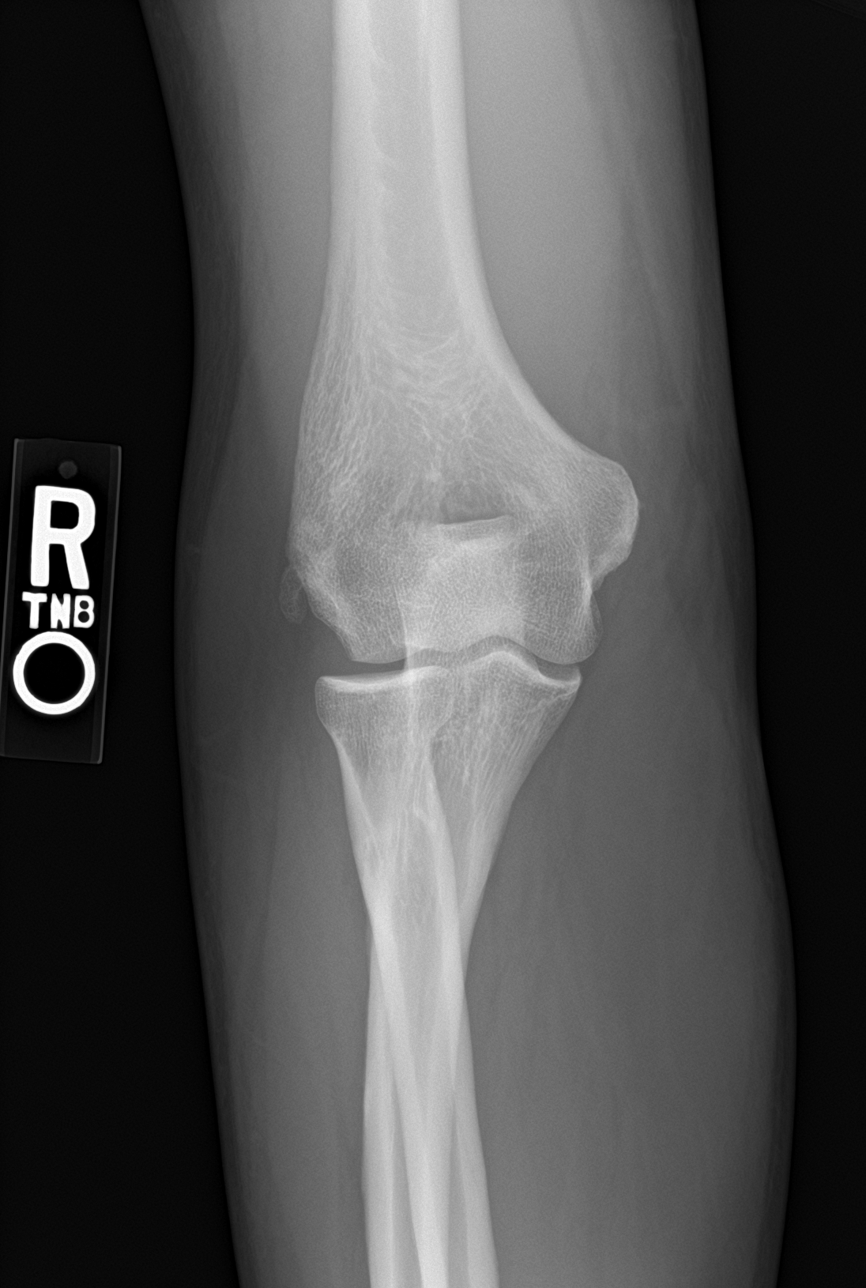

[elbow lat]
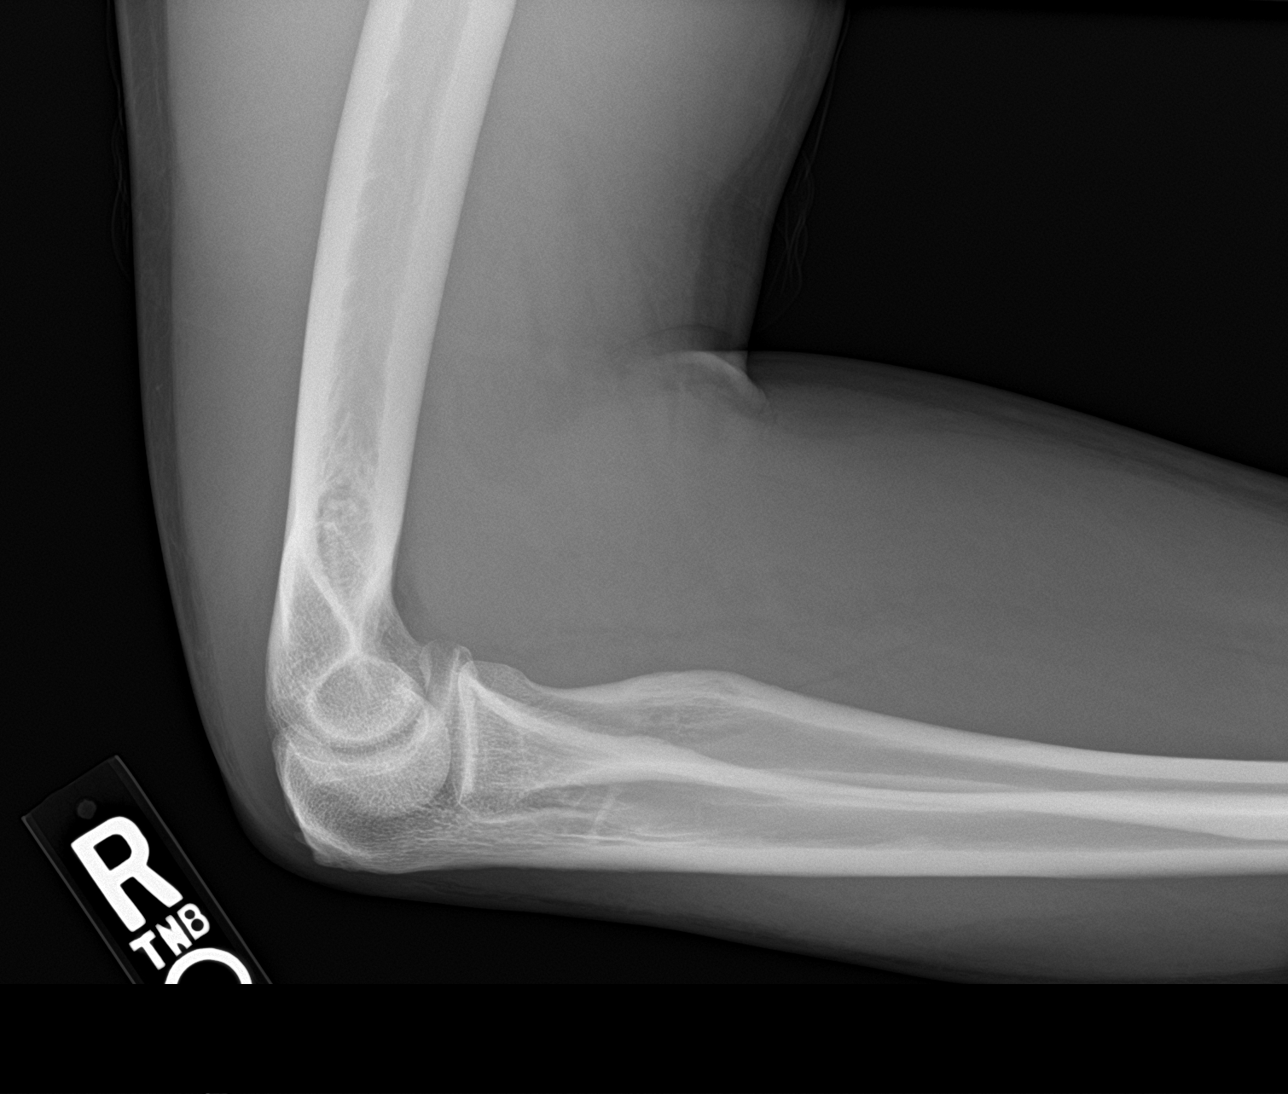

[4 of 4 positions shown; findings below may reference images not displayed]

FINDINGS: No evidence of elbow joint effusion. Bone mineralization is within
normal limits. Preserved joint spaces and alignment. Small chronic
appearing ossific fragment at the lateral epicondyle. No acute
osseous abnormality identified. No discrete soft tissue injury.
IMPRESSION: No acute osseous abnormality identified about the right elbow.

## 2023-01-29 ENCOUNTER — Encounter: Payer: Self-pay | Admitting: *Deleted
